# Patient Record
Sex: Female | Born: 1935
Health system: Southern US, Community
[De-identification: ages and names within clinical notes are randomized; demographics above are authoritative.]

---

## 2015-11-08 ENCOUNTER — Emergency Department (HOSPITAL_COMMUNITY): Payer: Medicare Other

## 2015-11-08 ENCOUNTER — Emergency Department (HOSPITAL_COMMUNITY)
Admission: EM | Admit: 2015-11-08 | Discharge: 2015-11-09 | Disposition: E | Payer: Medicare Other | Attending: Emergency Medicine | Admitting: Emergency Medicine

## 2015-11-08 DIAGNOSIS — I469 Cardiac arrest, cause unspecified: Secondary | ICD-10-CM | POA: Diagnosis not present

## 2015-11-08 DIAGNOSIS — R8279 Other abnormal findings on microbiological examination of urine: Secondary | ICD-10-CM | POA: Insufficient documentation

## 2015-11-08 LAB — COMPREHENSIVE METABOLIC PANEL
ALBUMIN: 1.7 g/dL — AB (ref 3.5–5.0)
ALT: 1627 U/L — ABNORMAL HIGH (ref 14–54)
ANION GAP: 13 (ref 5–15)
AST: 3249 U/L — ABNORMAL HIGH (ref 15–41)
Alkaline Phosphatase: 133 U/L — ABNORMAL HIGH (ref 38–126)
BILIRUBIN TOTAL: 0.6 mg/dL (ref 0.3–1.2)
BUN: 12 mg/dL (ref 6–20)
CHLORIDE: 116 mmol/L — AB (ref 101–111)
CO2: 13 mmol/L — ABNORMAL LOW (ref 22–32)
Calcium: 7.1 mg/dL — ABNORMAL LOW (ref 8.9–10.3)
Creatinine, Ser: 1.06 mg/dL — ABNORMAL HIGH (ref 0.44–1.00)
GFR calc Af Amer: 53 mL/min — ABNORMAL LOW (ref >60)
GFR calc non Af Amer: 45 mL/min — ABNORMAL LOW (ref >60)
GLUCOSE: 78 mg/dL (ref 65–99)
POTASSIUM: 4.6 mmol/L (ref 3.5–5.1)
SODIUM: 142 mmol/L (ref 135–145)
TOTAL PROTEIN: 4.2 g/dL — AB (ref 6.5–8.1)

## 2015-11-08 LAB — CBC WITH DIFFERENTIAL/PLATELET
BASOS ABS: 0 10*3/uL (ref 0.0–0.1)
Basophils Relative: 0 %
Eosinophils Absolute: 0 10*3/uL (ref 0.0–0.7)
Eosinophils Relative: 0 %
HEMATOCRIT: 29.3 % — AB (ref 36.0–46.0)
Hemoglobin: 8.4 g/dL — ABNORMAL LOW (ref 12.0–15.0)
LYMPHS ABS: 2.4 10*3/uL (ref 0.7–4.0)
Lymphocytes Relative: 40 %
MCH: 27.3 pg (ref 26.0–34.0)
MCHC: 28.7 g/dL — AB (ref 30.0–36.0)
MCV: 95.1 fL (ref 78.0–100.0)
MONO ABS: 0.2 10*3/uL (ref 0.1–1.0)
Monocytes Relative: 3 %
Neutro Abs: 3.4 10*3/uL (ref 1.7–7.7)
Neutrophils Relative %: 57 %
PLATELETS: 121 10*3/uL — AB (ref 150–400)
RBC: 3.08 MIL/uL — AB (ref 3.87–5.11)
RDW: 17.3 % — AB (ref 11.5–15.5)
WBC: 6 10*3/uL (ref 4.0–10.5)

## 2015-11-08 LAB — I-STAT CG4 LACTIC ACID, ED: LACTIC ACID, VENOUS: 11.65 mmol/L — AB (ref 0.5–1.9)

## 2015-11-08 LAB — I-STAT TROPONIN, ED: Troponin i, poc: 1.05 ng/mL (ref 0.00–0.08)

## 2015-11-08 MED ORDER — SODIUM CHLORIDE 0.9 % IV BOLUS (SEPSIS)
1000.0000 mL | Freq: Once | INTRAVENOUS | Status: AC
  Administered 2015-11-08: 1000 mL via INTRAVENOUS

## 2015-11-08 MED ORDER — CALCIUM CHLORIDE 10 % IV SOLN
INTRAVENOUS | Status: AC | PRN
  Administered 2015-11-08: 1 g via INTRAVENOUS

## 2015-11-08 MED ORDER — EPINEPHRINE HCL 0.1 MG/ML IJ SOSY
PREFILLED_SYRINGE | INTRAMUSCULAR | Status: AC | PRN
  Administered 2015-11-08: 1 mg via INTRAVENOUS

## 2015-11-08 MED ORDER — VANCOMYCIN HCL IN DEXTROSE 1-5 GM/200ML-% IV SOLN
1000.0000 mg | Freq: Once | INTRAVENOUS | Status: AC
  Administered 2015-11-08: 1000 mg via INTRAVENOUS
  Filled 2015-11-08: qty 200

## 2015-11-08 MED ORDER — NOREPINEPHRINE BITARTRATE 1 MG/ML IV SOLN
0.0000 ug/min | Freq: Once | INTRAVENOUS | Status: AC
  Administered 2015-11-08: 4 ug/min via INTRAVENOUS
  Filled 2015-11-08: qty 4

## 2015-11-08 MED ORDER — PIPERACILLIN-TAZOBACTAM 3.375 G IVPB 30 MIN
3.3750 g | Freq: Once | INTRAVENOUS | Status: AC
  Administered 2015-11-08: 3.375 g via INTRAVENOUS
  Filled 2015-11-08: qty 50

## 2015-11-08 MED ORDER — SODIUM BICARBONATE 8.4 % IV SOLN
INTRAVENOUS | Status: AC | PRN
  Administered 2015-11-08: 100 meq via INTRAVENOUS

## 2015-11-08 MED FILL — Medication: Qty: 1 | Status: AC

## 2015-11-09 NOTE — ED Notes (Signed)
Pt extubated by Dr. Patria Maneampos.

## 2015-11-09 NOTE — ED Notes (Signed)
Dr. Patria Maneampos at bedside discussing end of life measures with family member. Family decided to proceed with comfort measures. Family at beside.

## 2015-11-09 NOTE — ED Notes (Signed)
Per family. Stopped Levophed. MD campos at bedside.

## 2015-11-09 NOTE — ED Notes (Signed)
Time of death called by Dr. Patria Maneampos at 13:31.

## 2015-11-09 NOTE — ED Notes (Signed)
Campos at bedside. 

## 2015-11-09 NOTE — ED Notes (Signed)
I placed on patient zoll leads and 5 leads, blood pressure cuff and continuous pulse oximetry; Lanora ManisElizabeth, NT performed manual blood pressure and Nehemiah SettleBrooke, RN placed zoll pads on patient

## 2015-11-09 NOTE — ED Notes (Signed)
Voicemail left with patient placement that pt is being transported to morgue.

## 2015-11-09 NOTE — ED Notes (Signed)
Second set of cultured unable to obtain pt stuck my lab tech and RN. 5CC placed in blue top culture and 5CC placed in red top culture bottle.

## 2015-11-09 NOTE — ED Triage Notes (Signed)
Pt was LSN by family at 10pm last night and found down this am by daughter. Family started cpr on phone with 911. Asystole on ems arrival. resumed cpr gave 1mg  epi. AT pulse check pt then in Vfib. Pt shocked once another 1 mg epi pt return on pulses and epi dripped started. EMS reports losing pulses once more in transport. Pt arrives with 7.0 ET tube 23cm at lip. Pt is afib on monitor. GCS 3. Hr 92 weak femoral pulse. bp 90/50.

## 2015-11-09 NOTE — ED Notes (Signed)
Family at bedside. 

## 2015-11-09 NOTE — ED Notes (Signed)
Epi drip stopped by Dr. Patria Maneampos.

## 2015-11-09 DEATH — deceased

## 2015-11-10 LAB — URINE CULTURE: Culture: >=100000 — AB

## 2015-12-09 NOTE — ED Provider Notes (Signed)
[facility]-EMERGENCY DEPT Provider Note   CSN: [id number] Arrival date & time: Mar 09, 2016  1143     History   Chief Complaint Chief Complaint  Patient presents with  . Cardiac Arrest    Level V caveat: Unresponsive  HPI Courtney Lamb is a 82 y.o. female. Patient brought to the emergency department after out of hospital arrest.  Patient was found by daughter on the floor next to her bed this morning unresponsive.  Initial rhythm for EMS was asystole.  She underwent ACLS and intubation in the field.  After approximately 15-20 minutes return of spontaneous circulation occurred.  She had runs of A. fib with RVR.  She was also defibrillated once.  She had been in her normal state of health over the past several days per the daughter.  No other information is available.  On arrival to the emergency department she presents with a heart rate of 99 blood pressure 90/50 quickly followed by a blood pressure of 67/54.   The history is provided by medical records and the EMS personnel.    No past medical history on file.  There are no active problems to display for this patient.   No past surgical history on file.  OB History    No data available       Home Medications    Prior to Admission medications   Not on File    Family History No family history on file.  Social History Social History  Substance Use Topics  . Smoking status: Not on file  . Smokeless tobacco: Not on file  . Alcohol use Not on file     Allergies   Review of patient's allergies indicates no known allergies.   Review of Systems Review of Systems  Unable to perform ROS: Patient unresponsive     Physical Exam Updated Vital Signs BP (!) 67/47   Pulse (!) 50   Temp (!) 93.1 F (33.9 C) (Rectal)   Resp 19   Ht 5\' 6"  (1.676 m)   Wt 134 lb (60.8 kg)   SpO2 (!) 88%   BMI 21.63 kg/m   Physical Exam  Constitutional: She appears well-developed and well-nourished.  HENT:  Head: Atraumatic.  Eyes:   Fixed and dilated  Neck: Neck supple.  No crepitus  Cardiovascular: Normal rate.   Pulmonary/Chest:  Rhonchi and rales bilaterally.  Decreased breath sounds in the bases  Abdominal: Soft. She exhibits no distension.  Genitourinary:  Genitourinary Comments: Normal external genitalia  Musculoskeletal: She exhibits no deformity.  No deformity of extremities  Neurological:  GCS 3  Skin: Skin is warm. No rash noted.  Psychiatric:  Unable to test     ED Treatments / Results  Labs (all labs ordered are listed, but only abnormal results are displayed) Labs Reviewed  URINE CULTURE - Abnormal; Notable for the following:            All other components within normal limits  CBC WITH DIFFERENTIAL/PLATELET - Abnormal; Notable for the following:    RBC 3.08 (*)    Hemoglobin 8.4 (*)    HCT 29.3 (*)    MCHC 28.7 (*)    RDW 17.3 (*)    Platelets 121 (*)    All other components within normal limits  COMPREHENSIVE METABOLIC PANEL - Abnormal; Notable for the following:    Chloride 116 (*)    CO2 13 (*)    Creatinine, Ser 1.06 (*)    Calcium 7.1 (*)    Total Protein  4.2 (*)    Albumin 1.7 (*)    AST 3,249 (*)    ALT 1,627 (*)    Alkaline Phosphatase 133 (*)    GFR calc non Af Amer 45 (*)    GFR calc Af Amer 53 (*)    All other components within normal limits  I-STAT TROPOININ, ED - Abnormal; Notable for the following:    Troponin i, poc 1.05 (*)    All other components within normal limits  I-STAT CG4 LACTIC ACID, ED - Abnormal; Notable for the following:    Lactic Acid, Venous 11.65 (*)    All other components within normal limits    EKG  EKG Interpretation None       Radiology Dg Chest Portable 1 View  Result Date: 27-Nov-2015 CLINICAL DATA:  82 year old found unresponsive by her daughter earlier this morning. CPR in progress. Intubation. EXAM: PORTABLE CHEST 1 VIEW COMPARISON:  None. FINDINGS: Endotracheal tube tip in satisfactory position below the thoracic inlet  approximately 5-6 cm above the carina. NG tube appears to course is into the left lower lobe bronchus. External pacing pads in place. Severe diffuse bilateral perihilar airspace pulmonary edema. IMPRESSION: 1. Endotracheal tube tip in satisfactory position projecting approximately 5-6 cm above the carina. 2. NG tube appears to course into the left lower lobe bronchus. This should be removed and replaced. 3. Severe diffuse bilateral perihilar airspace pulmonary edema. Electronically Signed   By: Hulan Saas M.D.   On: 2015-11-27 12:18     +++++++++++++++++++++++++++++++++++++++++++++   Procedures .Critical Care Performed by: Azalia Bilis Authorized by: Azalia Bilis    Total critical care time: 75 minutes Critical care time was exclusive of separately billable procedures and treating other patients. Critical care was necessary to treat or prevent imminent or life-threatening deterioration. Critical care was time spent personally by me on the following activities: development of treatment plan with patient and/or surrogate as well as nursing, discussions with consultants, evaluation of patient's response to treatment, examination of patient, obtaining history from patient or surrogate, ordering and performing treatments and interventions, ordering and review of laboratory studies, ordering and review of radiographic studies, pulse oximetry and re-evaluation of patient's condition.    Cardiopulmonary Resuscitation (CPR) Procedure Note Directed/Performed by: Lyanne Co I personally directed ancillary staff and/or performed CPR in an effort to regain return of spontaneous circulation and to maintain cardiac, neuro and systemic perfusion.   ++++++++++++++++++++++++++++++++++++++++++++++++++++     Medications Ordered in ED Medications  norepinephrine (LEVOPHED) 4 mg in dextrose 5 % 250 mL (0.016 mg/mL) infusion (0 mcg/min Intravenous Stopped November 27, 2015 1247)  sodium chloride 0.9 %  bolus 1,000 mL (0 mLs Intravenous Stopped 2015/11/27 1241)  EPINEPHrine (ADRENALIN) 0.1 MG/ML injection (1 mg Intravenous Given 27-Nov-2015 1204)  sodium bicarbonate injection (100 mEq Intravenous Given 2015-11-27 1206)  calcium chloride injection (1 g Intravenous Given 11/27/15 1207)  vancomycin (VANCOCIN) IVPB 1000 mg/200 mL premix (0 mg Intravenous Stopped 2015/11/27 1335)  piperacillin-tazobactam (ZOSYN) IVPB 3.375 g (0 g Intravenous Stopped 2015-11-27 1335)     Initial Impression / Assessment and Plan / ED Course  I have reviewed the triage vital signs and the nursing notes.  Pertinent labs & imaging results that were available during my care of the patient were reviewed by me and considered in my medical decision making (see chart for details).  Clinical Course    Patient presented critically ill after out of hospital arrest with return of spontaneous circulation with intubation and ACLS .  She presented  with evidence of shock.  Unclear etiology.  GCS 3.  Never responsive in emergency department.  I suspect she had some significant and anoxic injury given her time of resuscitation.  It's unclear how long she was down prior to EMS arrival.  She however did have some times when her heart rate and blood pressure would pick up.  She eventually required IV pressor support and on 2 separate occasions had initiation of CPR again in the emergency department.  I was unable to stabilize the patient for long enough period of time to get her to the intensive care unit.  Given her advanced age I had a prolonged discussion with the patient's daughter who understood the gravity of the situation.  It was the daughter's wishes to withdraw care and the patient was ultimately extubated in the emergency department for comfort reasons with the patient's daughter at the bedside where the patient passed peacefully.    Final Clinical Impressions(s) / ED Diagnoses   Final diagnoses:  Cardiac arrest [facility])    New  Prescriptions There are no discharge medications for this patient.    Azalia Bilis, MD 11/09/15 1728

## 2017-08-05 IMAGING — CR DG CHEST 1V PORT
1 series · 1 of 1 positions shown · non-contrast
Comparison: None.

CLINICAL DATA: 89-year-old found unresponsive by her daughter
earlier this morning. CPR in progress. Intubation.

EXAM:
PORTABLE CHEST 1 VIEW

[AP]
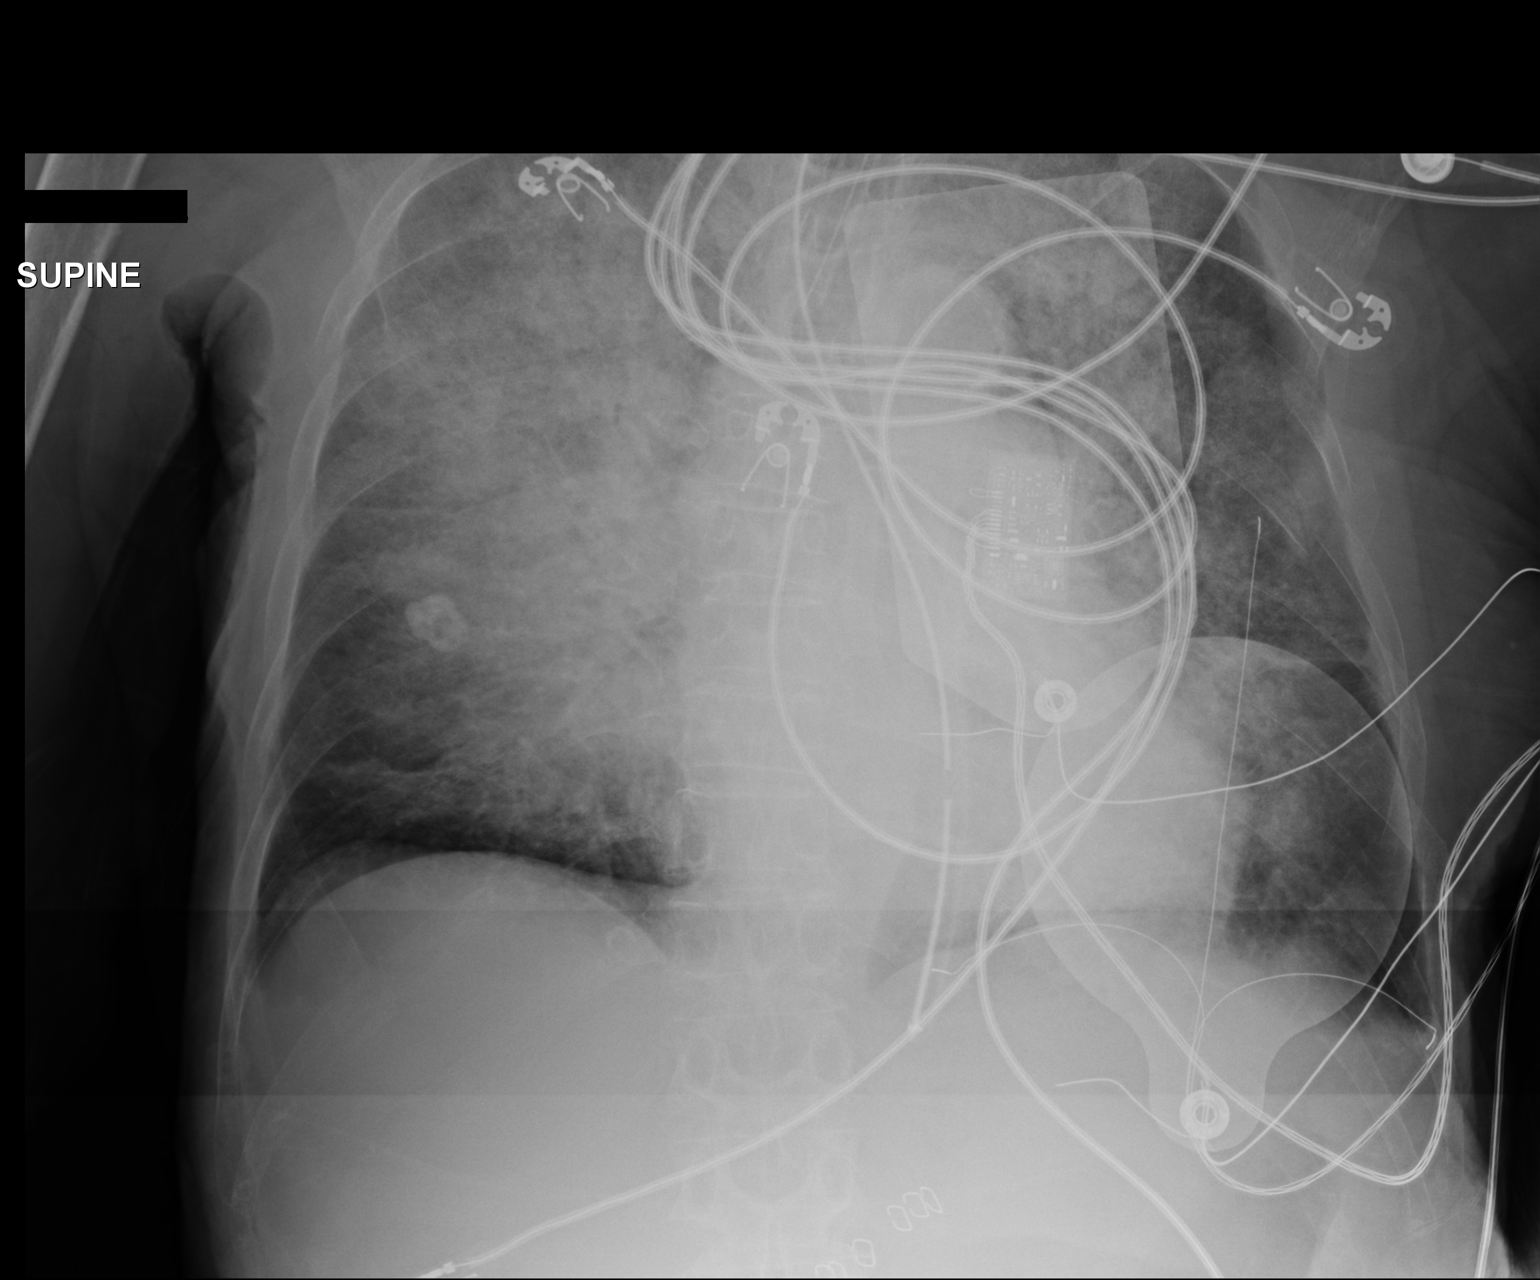

[1 of 1 positions shown; findings below may reference images not displayed]

FINDINGS: Endotracheal tube tip in satisfactory position below the thoracic
inlet approximately 5-6 cm above the carina. NG tube appears to
course is into the left lower lobe bronchus. External pacing pads in
place.

Severe diffuse bilateral perihilar airspace pulmonary edema.
IMPRESSION: 1. Endotracheal tube tip in satisfactory position projecting
approximately 5-6 cm above the carina.
2. NG tube appears to course into the left lower lobe bronchus. This
should be removed and replaced.
3. Severe diffuse bilateral perihilar airspace pulmonary edema.
